# Patient Record
Sex: Female | Born: 1995 | Race: Black or African American | Hispanic: No | Smoking: Never smoker
Health system: Southern US, Community
[De-identification: ages and names within clinical notes are randomized; demographics above are authoritative.]

## PROBLEM LIST (undated history)

## (undated) DIAGNOSIS — E282 Polycystic ovarian syndrome: Secondary | ICD-10-CM

## (undated) HISTORY — PX: LAPAROSCOPIC GASTRIC SLEEVE RESECTION: SHX5895

---

## 2020-04-28 ENCOUNTER — Emergency Department (HOSPITAL_COMMUNITY)
Admission: EM | Admit: 2020-04-28 | Discharge: 2020-04-29 | Disposition: A | Discharge status: Home / Self Care | Payer: BC Managed Care – PPO | Attending: Emergency Medicine | Admitting: Emergency Medicine

## 2020-04-28 ENCOUNTER — Emergency Department (HOSPITAL_COMMUNITY): Payer: BC Managed Care – PPO

## 2020-04-28 ENCOUNTER — Other Ambulatory Visit: Payer: Self-pay

## 2020-04-28 DIAGNOSIS — Z9884 Bariatric surgery status: Secondary | ICD-10-CM | POA: Insufficient documentation

## 2020-04-28 DIAGNOSIS — R112 Nausea with vomiting, unspecified: Secondary | ICD-10-CM | POA: Insufficient documentation

## 2020-04-28 DIAGNOSIS — R1110 Vomiting, unspecified: Secondary | ICD-10-CM | POA: Diagnosis present

## 2020-04-28 LAB — URINALYSIS, ROUTINE W REFLEX MICROSCOPIC
Glucose, UA: NEGATIVE mg/dL
Ketones, ur: 80 mg/dL — AB
Leukocytes,Ua: NEGATIVE
Nitrite: NEGATIVE
Protein, ur: 100 mg/dL — AB
Specific Gravity, Urine: 1.029 (ref 1.005–1.030)
pH: 5 (ref 5.0–8.0)

## 2020-04-28 LAB — COMPREHENSIVE METABOLIC PANEL
ALT: 125 U/L — ABNORMAL HIGH (ref 0–44)
AST: 69 U/L — ABNORMAL HIGH (ref 15–41)
Albumin: 4 g/dL (ref 3.5–5.0)
Alkaline Phosphatase: 50 U/L (ref 38–126)
Anion gap: 19 — ABNORMAL HIGH (ref 5–15)
BUN: 8 mg/dL (ref 6–20)
CO2: 19 mmol/L — ABNORMAL LOW (ref 22–32)
Calcium: 9.9 mg/dL (ref 8.9–10.3)
Chloride: 102 mmol/L (ref 98–111)
Creatinine, Ser: 1.02 mg/dL — ABNORMAL HIGH (ref 0.44–1.00)
GFR, Estimated: >60 mL/min (ref >60)
Glucose, Bld: 102 mg/dL — ABNORMAL HIGH (ref 70–99)
Potassium: 3.7 mmol/L (ref 3.5–5.1)
Sodium: 140 mmol/L (ref 135–145)
Total Bilirubin: 1.1 mg/dL (ref 0.3–1.2)
Total Protein: 7.2 g/dL (ref 6.5–8.1)

## 2020-04-28 LAB — CBC
HCT: 42.9 % (ref 36.0–46.0)
Hemoglobin: 13.6 g/dL (ref 12.0–15.0)
MCH: 27.6 pg (ref 26.0–34.0)
MCHC: 31.7 g/dL (ref 30.0–36.0)
MCV: 87 fL (ref 80.0–100.0)
Platelets: 287 10*3/uL (ref 150–400)
RBC: 4.93 MIL/uL (ref 3.87–5.11)
RDW: 13.8 % (ref 11.5–15.5)
WBC: 10 10*3/uL (ref 4.0–10.5)
nRBC: 0 % (ref 0.0–0.2)

## 2020-04-28 LAB — LIPASE, BLOOD: Lipase: 48 U/L (ref 11–51)

## 2020-04-28 LAB — I-STAT BETA HCG BLOOD, ED (MC, WL, AP ONLY): I-stat hCG, quantitative: <5 m[IU]/mL (ref <5)

## 2020-04-28 MED ORDER — METOCLOPRAMIDE HCL 5 MG/ML IJ SOLN
10.0000 mg | Freq: Once | INTRAMUSCULAR | Status: AC
  Administered 2020-04-28: 10 mg via INTRAVENOUS
  Filled 2020-04-28: qty 2

## 2020-04-28 MED ORDER — DIPHENHYDRAMINE HCL 50 MG/ML IJ SOLN
25.0000 mg | Freq: Once | INTRAMUSCULAR | Status: AC
  Administered 2020-04-28: 25 mg via INTRAVENOUS
  Filled 2020-04-28: qty 1

## 2020-04-28 MED ORDER — SODIUM CHLORIDE 0.9 % IV BOLUS
1000.0000 mL | Freq: Once | INTRAVENOUS | Status: AC
  Administered 2020-04-28: 1000 mL via INTRAVENOUS

## 2020-04-28 MED ORDER — IOHEXOL 300 MG/ML  SOLN
100.0000 mL | Freq: Once | INTRAMUSCULAR | Status: AC | PRN
  Administered 2020-04-28: 100 mL via INTRAVENOUS

## 2020-04-28 NOTE — ED Triage Notes (Signed)
Pt reports n/v without abdominal pain x 1 week. 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 Unable to tolerate PO intake. Endorses chest pain with deep breaths.

## 2020-04-28 NOTE — ED Notes (Signed)
Pt reports heavy period x9 days.

## 2020-04-28 NOTE — ED Provider Notes (Signed)
[facility] EMERGENCY DEPARTMENT Provider Note   CSN: [id number] Arrival date & time: 04/28/20  1630     History Chief Complaint  Patient presents with  . Vomiting    Courtney Wiley is a 24 y.o. female.  Patient presents to the emergency department with a chief complaint of nausea and vomiting.  She is 6 weeks status post gastric sleeve at [facility] [place].  She states that for the past 1 week, she has had persistent nausea and vomiting.  She was seen by her surgeon 2 days ago and had normal barium swallow study.  She states that he told her he would start her on IV fluid if she continued to vomit, but the office was closed today due to the weekend.  She denies any fevers or chills.  She reports mild abdominal discomfort.  She denies any other associated symptoms.  The history is provided by the patient. No language interpreter was used.       No past medical history on file.  There are no problems to display for this patient.  OB History   No obstetric history on file.     No family history on file.  Social History   Tobacco Use  . Smoking status: Not on file  Substance Use Topics  . Alcohol use: Not on file  . Drug use: Not on file    Home Medications Prior to Admission medications   Not on File    Allergies    Patient has no known allergies.  Review of Systems   Review of Systems  All other systems reviewed and are negative.   Physical Exam Updated Vital Signs BP 134/86   Pulse 85   Temp 98.2 F (36.8 C) (Oral)   Resp 18   Ht 5\' 2"  (1.575 m)   Wt 94.3 kg   SpO2 100%   BMI 38.04 kg/m   Physical Exam Vitals and nursing note reviewed.  Constitutional:      General: She is not in acute distress.    Appearance: She is well-developed.  HENT:     Head: Normocephalic and atraumatic.  Eyes:     Conjunctiva/sclera: Conjunctivae normal.  Cardiovascular:     Rate and Rhythm: Normal rate and regular rhythm.     Heart sounds:  No murmur heard.   Pulmonary:     Effort: Pulmonary effort is normal. No respiratory distress.     Breath sounds: Normal breath sounds.  Abdominal:     Palpations: Abdomen is soft.     Tenderness: There is no abdominal tenderness.  Musculoskeletal:        General: Normal range of motion.     Cervical back: Neck supple.  Skin:    General: Skin is warm and dry.  Neurological:     Mental Status: She is alert and oriented to person, place, and time.  Psychiatric:        Mood and Affect: Mood normal.        Behavior: Behavior normal.     ED Results / Procedures / Treatments   Labs (all labs ordered are listed, but only abnormal results are displayed) Labs Reviewed  COMPREHENSIVE METABOLIC PANEL - Abnormal; Notable for the following components:      Result Value   CO2 19 (*)    Glucose, Bld 102 (*)    Creatinine, Ser 1.02 (*)    AST 69 (*)    ALT 125 (*)    Anion gap  19 (*)    All other components within normal limits  URINALYSIS, ROUTINE W REFLEX MICROSCOPIC - Abnormal; Notable for the following components:   Color, Urine AMBER (*)    APPearance HAZY (*)    Hgb urine dipstick LARGE (*)    Bilirubin Urine SMALL (*)    Ketones, ur 80 (*)    Protein, ur 100 (*)    Bacteria, UA RARE (*)    All other components within normal limits  LIPASE, BLOOD  CBC  I-STAT BETA HCG BLOOD, ED ([facility], [facility], [facility] ONLY)    EKG EKG Interpretation  Date/Time:  Saturday April 28 2020 16:38:15 EDT Ventricular Rate:  101 PR Interval:  138 QRS Duration: 70 QT Interval:  320 QTC Calculation: 414 R Axis:   14 Text Interpretation: Sinus tachycardia Cannot rule out Anterior infarct , age undetermined Abnormal ECG No previous ECGs available Confirmed by Richardean Canal [phone]) on 04/28/2020 9:37:26 PM   Radiology No results found.  Procedures Procedures (including critical care time)  Medications Ordered in ED Medications  sodium chloride 0.9 % bolus 1,000 mL (1,000 mLs Intravenous New Bag/Given  04/28/20 2252)  metoCLOPramide (REGLAN) injection 10 mg (10 mg Intravenous Given 04/28/20 2255)  diphenhydrAMINE (BENADRYL) injection 25 mg (25 mg Intravenous Given 04/28/20 2254)    ED Course  I have reviewed the triage vital signs and the nursing notes.  Pertinent labs & imaging results that were available during my care of the patient were reviewed by me and considered in my medical decision making (see chart for details).    MDM Rules/Calculators/A&P                          This patient complains of nausea and vomiting, this involves an extensive number of treatment options, and is a complaint that carries with it a high risk of complications and morbidity.    Differential Dx Obstruction, post-surgical complication, dehydration  Pertinent Labs I ordered, reviewed, and interpreted labs, which included CBC without leukocytosis or evidence of anemia, CMP notable for mildly elevated LFTs and slightly elevated creatinine, no electrolyte derangement, lipase is normal, pregnancy test negative.  Imaging Interpretation I ordered imaging studies which included CT abdomen/pelvis, which showed no obstructive process, there is an incidental finding of a subacute spleen hematoma, thought to have been from retractor injury to spleen.   Medications I ordered medication Reglan, Benadryl, and IV fluid for nausea and dehydration.  Reassessments After the interventions stated above, I reevaluated the patient and found comfortable appearing.  I notified the patient of her CT findings including the incidental splenic injury finding.  Consultants Case and CT findings discussed with Dr. Janee Morn from trauma surgery, who states that the spleen hematoma should reabsorb.  No treatment indicated at 6 weeks from presumed injury with stable H/H.  Plan Discharge after fluids.  She will need to follow-up with her surgeon.  She does not appear toxic or extremely dehydrated.  Will trial rectal  Phenergan.    Final Clinical Impression(s) / ED Diagnoses Final diagnoses:  Nausea and vomiting, intractability of vomiting not specified, unspecified vomiting type    Rx / DC Orders ED Discharge Orders         Ordered    promethazine (PHENERGAN) 25 MG suppository  Every 6 hours PRN        04/29/20 0311           Roxy Horseman, PA-C 04/29/20 0343    Silverio Lay,  Gonzella Lex, MD 05/02/20 [phone]

## 2020-04-29 MED ORDER — LACTATED RINGERS IV BOLUS
1000.0000 mL | Freq: Once | INTRAVENOUS | Status: AC
  Administered 2020-04-29: 1000 mL via INTRAVENOUS

## 2020-04-29 MED ORDER — PROMETHAZINE HCL 25 MG RE SUPP: 25.0000 mg | Freq: Four times a day (QID) | RECTAL | 0 refills | Status: DC | PRN

## 2020-04-29 NOTE — Discharge Instructions (Signed)
Your CT scan did not reveal any obstructive process.  There was an incidental finding of a healing spleen laceration.  No further treatment should be needed in regard to this injury.  Please take medications as prescribed.  Please follow-up with your doctor.  Return for new or worsening symptoms.

## 2020-10-31 ENCOUNTER — Other Ambulatory Visit: Payer: Self-pay

## 2020-10-31 ENCOUNTER — Ambulatory Visit (HOSPITAL_COMMUNITY)
Admission: EM | Admit: 2020-10-31 | Discharge: 2020-10-31 | Disposition: A | Discharge status: Home / Self Care | Payer: BC Managed Care – PPO

## 2020-10-31 DIAGNOSIS — R45851 Suicidal ideations: Secondary | ICD-10-CM | POA: Insufficient documentation

## 2020-10-31 DIAGNOSIS — F4322 Adjustment disorder with anxiety: Secondary | ICD-10-CM | POA: Diagnosis not present

## 2020-10-31 NOTE — Discharge Instructions (Signed)
°  Discharge recommendations:  °Patient is to take medications as prescribed. °Please see information for follow-up appointment with psychiatry and therapy. °Please follow up with your primary care provider for all medical related needs.  ° °Therapy: We recommend that patient participate in individual therapy to address mental health concerns. ° ° ° ° °Safety:  °The patient should abstain from use of illicit substances/drugs and abuse of any medications. °If symptoms worsen or do not continue to improve or if the patient becomes actively suicidal or homicidal then it is recommended that the patient return to the closest hospital emergency department, the [facility], or call 911 for further evaluation and treatment. °National Suicide Prevention Lifeline 1-800-SUICIDE or 1-800-273-8255.  °

## 2020-10-31 NOTE — Progress Notes (Signed)
   10/31/20 1736  [facility] Triage Screening (Walk-ins at [facility] only)  How Did You Hear About [place]? Other (Comment) (Patient presents with her supervisor)  What Is the Reason for Your Visit/Call Today? Patient presents with her supervisor, Jason Fila, due to safety concerns.  Patient was at work today, when she took a call and got pretty upset.  She left work and didn't return for a while. When she returned, she infomed Deanna that she doesn't want to be here.  She clarified that she doesn't want to live and stated she needs help.  Patient asked Deanna to drive her, as she was having thoughts to drive her car off the road.  Patient is tearful initially, however now is saying she is not suicidal and "I really don't want to die."  Patient states the primary stressor is dealing a boyfriend who she thinks is Bipolar and won't seek treatment.  They have had multiple break ups and he broke up with her again today.  She is hopeful she can convince him to seek treatment.  How Long Has This Been Causing You Problems? 1 wk - 1 month  Have You Recently Had Any Thoughts About Hurting Yourself? Yes  How long ago did you have thoughts about hurting yourself? SI today, after upsetting phone call with boyfriend  - broke up with her  Are You Planning to Commit Suicide/Harm Yourself At This time? No  Have you Recently Had Thoughts About Hurting Someone Karolee Ohs? No  Are You Planning To Harm Someone At This Time? No  Are you currently experiencing any auditory, visual or other hallucinations? No  Have You Used Any Alcohol or Drugs in the Past 24 Hours? No  Do you have any current medical co-morbidities that require immediate attention? No  What Do You Feel Would Help You the Most Today? Treatment for Depression or other mood problem  If access to [facility] was not available, would you have sought care in the Emergency Department? Yes  Determination of Need Urgent (48 hours)  Options For Referral Outpatient [facility]

## 2020-10-31 NOTE — ED Provider Notes (Signed)
Behavioral Health Urgent Care Medical Screening Exam  Patient Name: Courtney Wiley MRN: [id number] Date of Evaluation: 10/31/20 Chief Complaint: Chief Complaint/Presenting Problem: Courtney Wiley is a 25yo female that presents to [facility] as a walk in for assessment of suicidal thoughts. 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  Pt reports that she has had suicidal ideation in the past, typically triggered by breakups. Pt denies that she is currently being treated by a psychiatrist or therapist. Pt reports that she did see a psychologist in the past but for a short period of time. Pt reports that she is currently having suicidal ideation with no specified plans or intent to follow through "I don't want to go to hell".  Pt denies any HI or AVH.  Pt denies any substance use other than social etoh. Pt is very upset about recent breakup with boyfriend "I think he's bipolar because one day he says that he loves me and the next he wants to break up". Pt states that she currently lives with boyfriend and all of her positive activities and hobbies surround doing things with him. Pt reports that she had weight loss surgery in Oct. 2021 and was hospitalized for three months due to post-operative complications. Pt states that she is now able to eat without complications and makes healthy choices. Pt has lost 70 lbs since the surgery. Pt presents with appropriate dress, normal motor activity, fair insight, anxious/depressed/tearful mood, with appropriate thought content. Pt does not feel that she is a danger to herself at time of assessment. 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 female who presents to [facility] as a walk-in after expressing suicidal thoughts. Patient reports that she received a call from her boyfriend while she was at work. States that her boyfriend told her that he wanted to break up. She states that she made a  suicidal statement "to get attention." She states that she was not suicidal when she made the statement. She states "I say that sometimes when I get mad, but I am not suicidal and I do not want to die." She denies a history of suicide attempts. She denies feeling of depression, hopelessness, worthlessness. She reports some anxiety related to break up. She denies a history of psychiatric treatment. States that she did see a psychologist once when she 21, after her parents divorce. States that she has never taken psychotropics.  On evaluation patient is alert and oriented x 4, pleasant, and cooperative. Speech is clear and coherent. Mood is anxious and affect is congruent with mood. Thought process is coherent and thought content is logical. Denies auditory and visual hallucinations. No indication that patient is responding to internal stimuli. No evidence of delusional thought content. Denies suicidal ideations. Denies homicidal ideations. Reports occasional alcohol use. Denies use of other illicit substances.   Psychiatric Specialty Exam  Presentation  General Appearance:Appropriate for Environment; Well Groomed  Eye Contact:Good  Speech:Clear and Coherent; Normal Rate  Speech Volume:Normal  Handedness:No data recorded  Mood and Affect  Mood:Anxious  Affect:Congruent   Thought Process  Thought Processes:Coherent; Goal Directed; Linear  Descriptions of Associations:Intact  Orientation:Full (Time, Place and Person)  Thought Content:WDL  Diagnosis of Schizophrenia or Schizoaffective disorder in past: No   Hallucinations:None  Ideas of Reference:None  Suicidal Thoughts:No  Homicidal Thoughts:No   Sensorium  Memory:Immediate Good; Recent Good; Remote Good  Judgment:Fair  Insight:Good   Executive Functions  Concentration:Good  Attention Span:Good  Recall:Good  Fund of Knowledge:Good  Language:Good   Psychomotor Activity  Psychomotor Activity:Normal   Assets   Assets:Communication Skills; Desire for Improvement; Housing; Physical Health; Social Support; Resilience; Transportation   Sleep  Sleep:Good  Number of hours: No data recorded  No data recorded  Physical Exam: Physical Exam Constitutional:      General: She is not in acute distress.    Appearance: She is not ill-appearing, toxic-appearing or diaphoretic.  HENT:     Head: Normocephalic.     Right Ear: External ear normal.     Left Ear: External ear normal.  Eyes:     Conjunctiva/sclera: Conjunctivae normal.     Pupils: Pupils are equal, round, and reactive to light.  Cardiovascular:     Rate and Rhythm: Normal rate.  Pulmonary:     Effort: Pulmonary effort is normal. No respiratory distress.  Musculoskeletal:        General: Normal range of motion.  Skin:    General: Skin is warm and dry.  Neurological:     Mental Status: She is alert and oriented to person, place, and time.  Psychiatric:        Mood and Affect: Mood is anxious. Mood is not depressed.        Thought Content: Thought content is not paranoid or delusional. Thought content does not include homicidal or suicidal ideation.    Review of Systems  Constitutional: Negative for chills, diaphoresis, fever, malaise/fatigue and weight loss.  HENT: Negative for congestion.   Respiratory: Negative for cough and shortness of breath.   Cardiovascular: Negative for chest pain and palpitations.  Gastrointestinal: Negative for diarrhea, nausea and vomiting.  Neurological: Negative for dizziness and seizures.  Psychiatric/Behavioral: Negative for depression, hallucinations, memory loss, substance abuse and suicidal ideas. The patient is nervous/anxious. The patient does not have insomnia.   All other systems reviewed and are negative.  Blood pressure (!) 143/95, pulse 96, temperature 97.8 F (36.6 C), temperature source Temporal, resp. rate 18, SpO2 98 %. There is no height or weight on file to calculate  BMI.  Musculoskeletal: Strength & Muscle Tone: within normal limits Gait & Station: normal Patient leans: N/A  Demographic Factors:  Adolescent or young adult  Loss Factors: NA  Historical Factors: Family history of mental illness or substance abuse and NA  Risk Reduction Factors:   Sense of responsibility to family, Religious beliefs about death, Employed and Living with another person, especially a relative  Continued Clinical Symptoms:  anxiety  Cognitive Features That Contribute To Risk:  None    Suicide Risk:  Mild:  Suicidal ideation of limited frequency, intensity, duration, and specificity.  There are no identifiable plans, no associated intent, mild dysphoria and related symptoms, good self-control (both objective and subjective assessment), few other risk factors, and identifiable protective factors, including available and accessible social support.  Patient does not appear to be an imminent risk to self or others.   [facility] Discharge Disposition for Follow up and Recommendations: Based on my evaluation the patient does not appear to have an emergency medical condition and can be discharged with resources and follow up care in outpatient services for Medication Management and Individual Therapy   TTS provided with outpatient resources.   Patient's brother provided transportation home.    Jackelyn Poling, NP 10/31/2020, 7:59 PM
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
## 2021-04-14 ENCOUNTER — Other Ambulatory Visit: Payer: Self-pay

## 2021-04-14 ENCOUNTER — Ambulatory Visit (HOSPITAL_COMMUNITY)
Admission: EM | Admit: 2021-04-14 | Discharge: 2021-04-14 | Disposition: A | Discharge status: Home / Self Care | Payer: Managed Care, Other (non HMO) | Attending: Student | Admitting: Student

## 2021-04-14 ENCOUNTER — Encounter (HOSPITAL_COMMUNITY): Payer: Self-pay | Admitting: Emergency Medicine

## 2021-04-14 DIAGNOSIS — Z789 Other specified health status: Secondary | ICD-10-CM

## 2021-04-14 DIAGNOSIS — N926 Irregular menstruation, unspecified: Secondary | ICD-10-CM | POA: Diagnosis not present

## 2021-04-14 DIAGNOSIS — Z3202 Encounter for pregnancy test, result negative: Secondary | ICD-10-CM

## 2021-04-14 HISTORY — DX: Polycystic ovarian syndrome: E28.2

## 2021-04-14 LAB — POC URINE PREG, ED: Preg Test, Ur: NEGATIVE

## 2021-04-14 NOTE — Discharge Instructions (Addendum)
-  Your pregnancy test was negative today! Continue patch to prevent pregnancy.

## 2021-04-14 NOTE — ED Provider Notes (Signed)
[facility]    CSN: [id number] Arrival date & time: 04/14/21  1255      History   Chief Complaint Chief Complaint  Patient presents with   Possible Pregnancy    HPI Courtney Wiley is a 25 y.o. female presenting with concern for pregnancy.  States that she has a history of gastric sleeve, and experiences some nausea related to this.  Following nausea few weeks ago it took 6 home pregnancy tests, these were both negative and positive.  Took a digital pregnancy test 1 day ago and this was positive.  Coming here today to confirm.  Periods are irregular at baseline, LMP 8/23.  Patch for contraception. Nausea has resolved. Does not desire to be pregnant.  HPI  Past Medical History:  Diagnosis Date   PCOS (polycystic ovarian syndrome)     Patient Active Problem List   Diagnosis Date Noted   Adjustment disorder with anxious mood    Suicidal thoughts     Past Surgical History:  Procedure Laterality Date   LAPAROSCOPIC GASTRIC SLEEVE RESECTION      OB History   No obstetric history on file.      Home Medications    Prior to Admission medications   Medication Sig Start Date End Date Taking? Authorizing Provider  promethazine (PHENERGAN) 25 MG suppository Place 1 suppository (25 mg total) rectally every 6 (six) hours as needed for nausea or vomiting. 04/29/20   Roxy Horseman, PA-C    Family History No family history on file.  Social History     Allergies   Patient has no known allergies.   Review of Systems Review of Systems  Constitutional:  Negative for appetite change, chills, diaphoresis and fever.  Respiratory:  Negative for shortness of breath.   Cardiovascular:  Negative for chest pain.  Gastrointestinal:  Negative for abdominal pain, blood in stool, constipation, diarrhea, nausea and vomiting.  Genitourinary:  Negative for decreased urine volume, difficulty urinating, dysuria, flank pain, frequency, genital sores, hematuria, menstrual  problem, pelvic pain, urgency, vaginal bleeding, vaginal discharge and vaginal pain.  Musculoskeletal:  Negative for back pain.  Neurological:  Negative for dizziness, weakness and light-headedness.  All other systems reviewed and are negative.   Physical Exam Triage Vital Signs ED Triage Vitals  Enc Vitals Group     BP 04/14/21 1341 121/87     Pulse Rate 04/14/21 1341 85     Resp 04/14/21 1341 18     Temp 04/14/21 1341 98.2 F (36.8 C)     Temp Source 04/14/21 1341 Oral     SpO2 04/14/21 1341 98 %     Weight --      Height --      Head Circumference --      Peak Flow --      Pain Score 04/14/21 1339 0     Pain Loc --      Pain Edu? --      Excl. in GC? --    No data found.  Updated Vital Signs BP 121/87 (BP Location: Right Arm)   Pulse 85   Temp 98.2 F (36.8 C) (Oral)   Resp 18   LMP 03/12/2021   SpO2 98%   Visual Acuity Right Eye Distance:   Left Eye Distance:   Bilateral Distance:    Right Eye Near:   Left Eye Near:    Bilateral Near:     Physical Exam Vitals reviewed.  Constitutional:      General: She  is not in acute distress.    Appearance: Normal appearance. She is not ill-appearing.  HENT:     Head: Normocephalic and atraumatic.     Mouth/Throat:     Mouth: Mucous membranes are moist.     Comments: Moist mucous membranes Eyes:     Extraocular Movements: Extraocular movements intact.     Pupils: Pupils are equal, round, and reactive to light.  Cardiovascular:     Rate and Rhythm: Normal rate and regular rhythm.     Heart sounds: Normal heart sounds.  Pulmonary:     Effort: Pulmonary effort is normal.     Breath sounds: Normal breath sounds. No wheezing, rhonchi or rales.  Abdominal:     General: Bowel sounds are normal. There is no distension.     Palpations: Abdomen is soft. There is no mass.     Tenderness: There is no abdominal tenderness. There is no right CVA tenderness, left CVA tenderness, guarding or rebound.  Skin:    General:  Skin is warm.     Capillary Refill: Capillary refill takes less than 2 seconds.     Comments: Good skin turgor  Neurological:     General: No focal deficit present.     Mental Status: She is alert and oriented to person, place, and time.  Psychiatric:        Mood and Affect: Mood normal.        Behavior: Behavior normal.     UC Treatments / Results  Labs (all labs ordered are listed, but only abnormal results are displayed) Labs Reviewed  POC URINE PREG, ED    EKG   Radiology No results found.  Procedures Procedures (including critical care time)  Medications Ordered in UC Medications - No data to display  Initial Impression / Assessment and Plan / UC Course  I have reviewed the triage vital signs and the nursing notes.  Pertinent labs & imaging results that were available during my care of the patient were reviewed by me and considered in my medical decision making (see chart for details).     This patient is a very pleasant 25 y.o. year old female presenting for pregnancy test. Negative. Reassurance provided. Continue patch for contraception. ED return precautions discussed. Patient verbalizes understanding and agreement.    Final Clinical Impressions(s) / UC Diagnoses   Final diagnoses:  Negative pregnancy test  Uses hormonal contraceptive patch as primary birth control method  Irregular periods     Discharge Instructions      -Your pregnancy test was negative today! Continue patch to prevent pregnancy.   ED Prescriptions   None    PDMP not reviewed this encounter.   Rhys Martini, PA-C 04/14/21 1406

## 2021-04-14 NOTE — ED Triage Notes (Signed)
Pt taken many home test and some show both positive and negative for pregnancy. Pt wants to be sure.

## 2022-05-03 IMAGING — CT CT ABD-PELV W/ CM
2 of 4 series · 16 of 46 positions shown, 18 images · IV contrast (Omni 300)
Comparison: None

CLINICAL DATA: Abdominal distension

EXAM:
CT ABDOMEN AND PELVIS WITH CONTRAST
TECHNIQUE: Multidetector CT imaging of the abdomen and pelvis was performed
using the standard protocol following bolus administration of
intravenous contrast.
CONTRAST:  100mL OMNIPAQUE IOHEXOL 300 MG/ML  SOLN

[Series 3: a/p w/ 5mm · axial · 0.98mm/px · z∈[+614,+1054]mm · 13 of 97 slices shown, 15 images]
[im 5/97  soft-tissue]
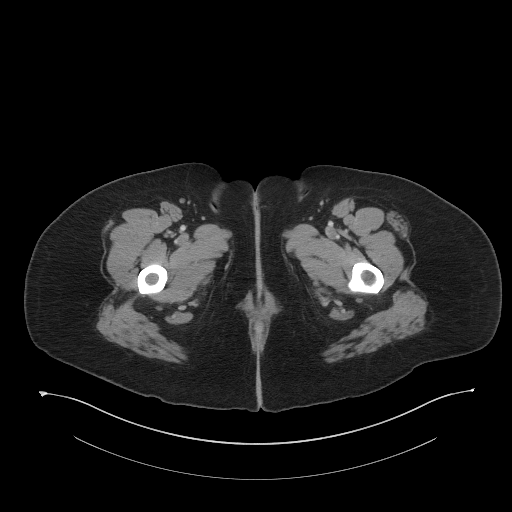
[im 5/97  bone]
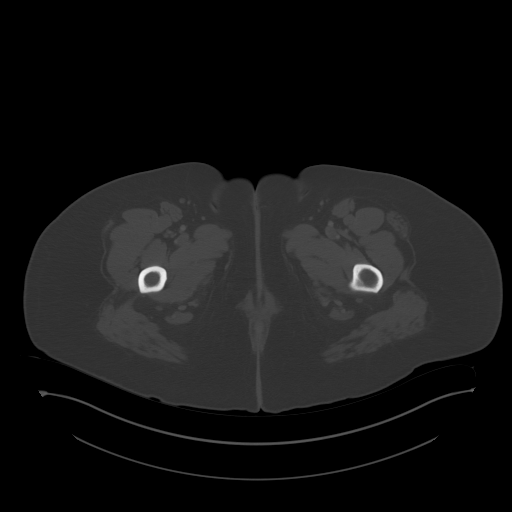
[im 13/97  soft-tissue]
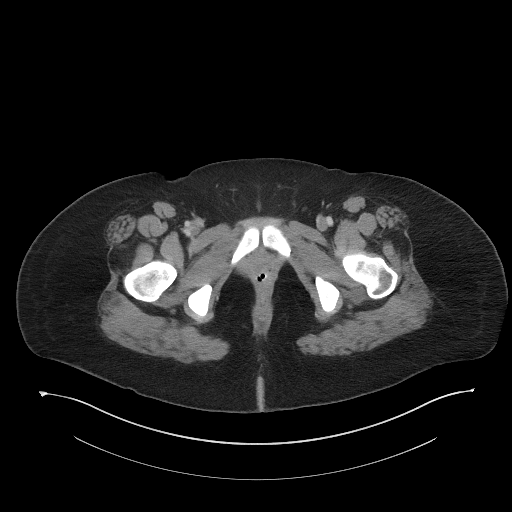
[im 21/97  soft-tissue]
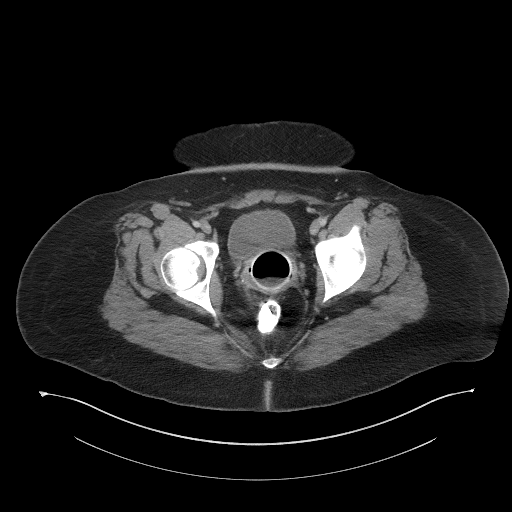
[im 29/97  soft-tissue]
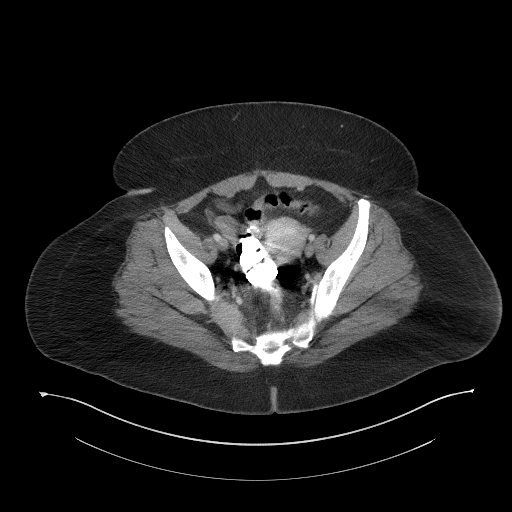
[im 33/97  soft-tissue]
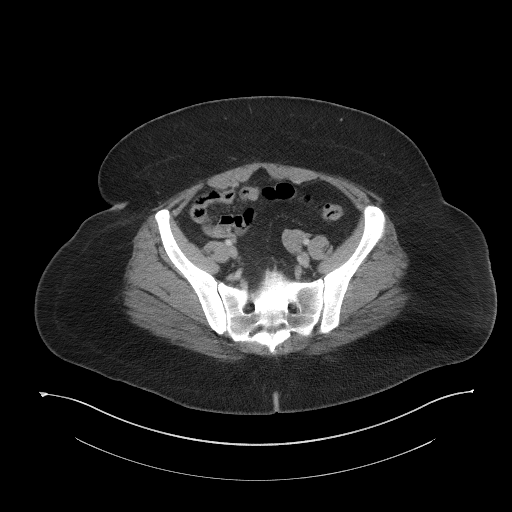
[im 41/97  soft-tissue]
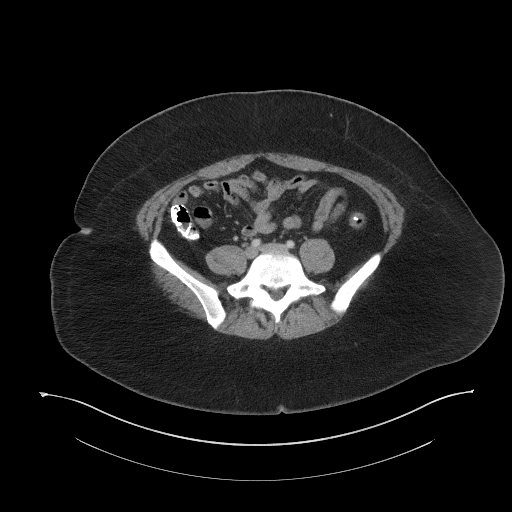
[im 49/97  soft-tissue]
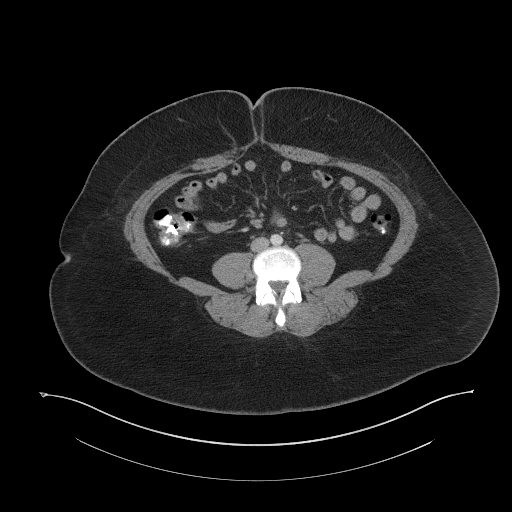
[im 57/97  soft-tissue]
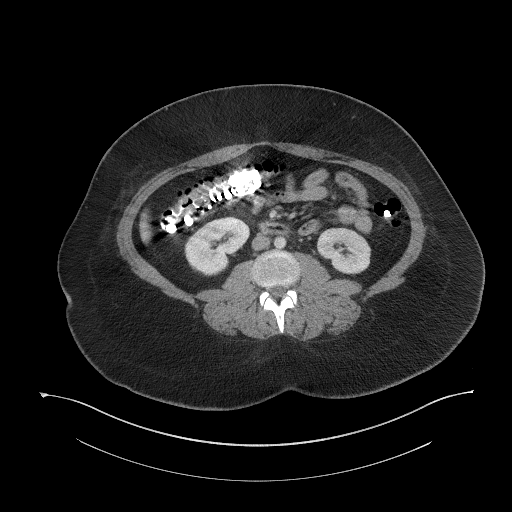
[im 65/97  soft-tissue]
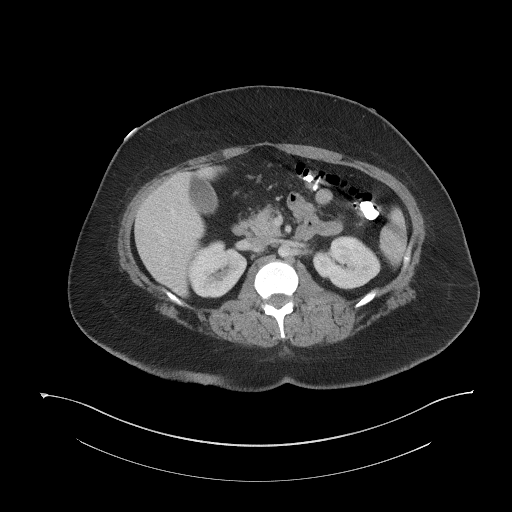
[im 65/97  bone]
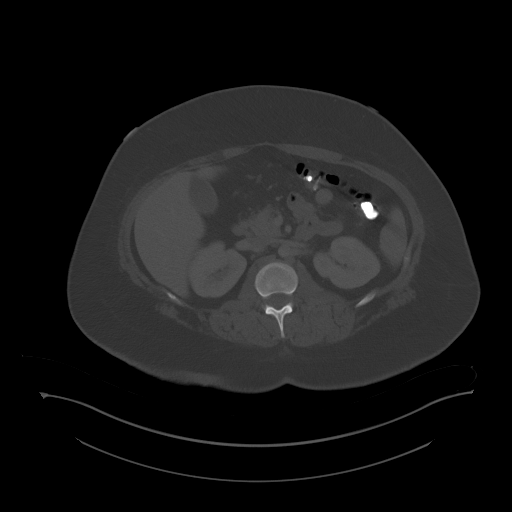
[im 69/97  soft-tissue]
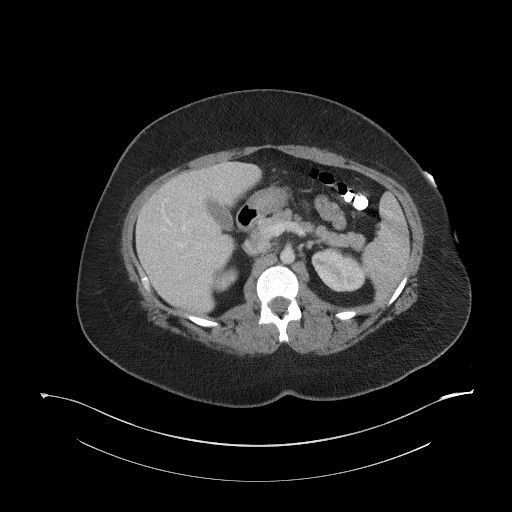
[im 77/97  soft-tissue]
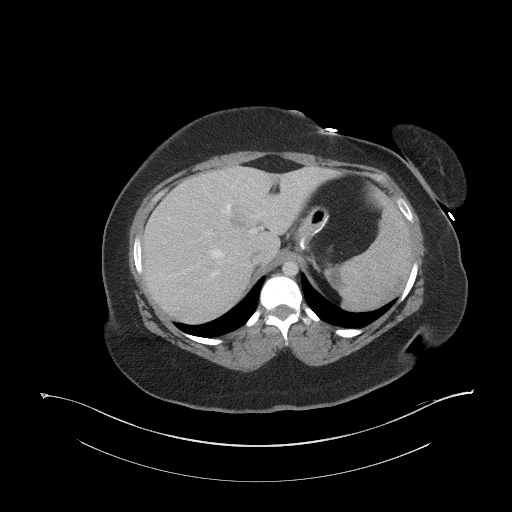
[im 85/97  soft-tissue]
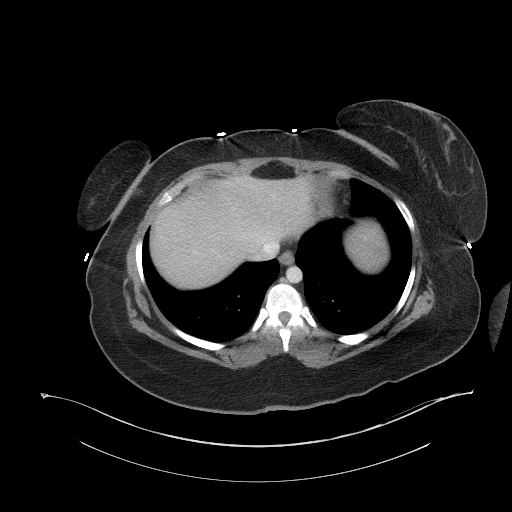
[im 93/97  soft-tissue]
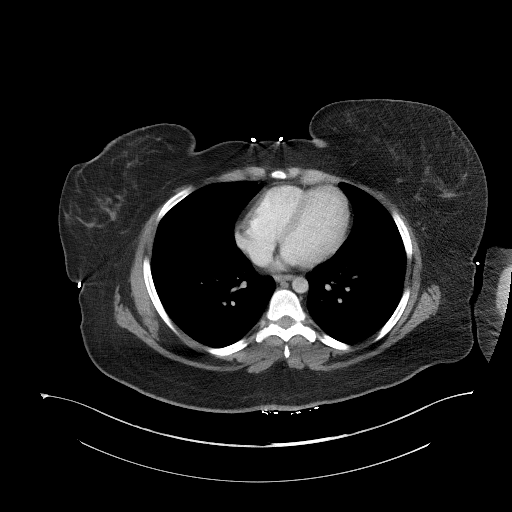

[Series 6: a/p w/ cor · coronal · 0.95mm/px · 3 of 165 slices shown]
[im 55/165  soft-tissue]
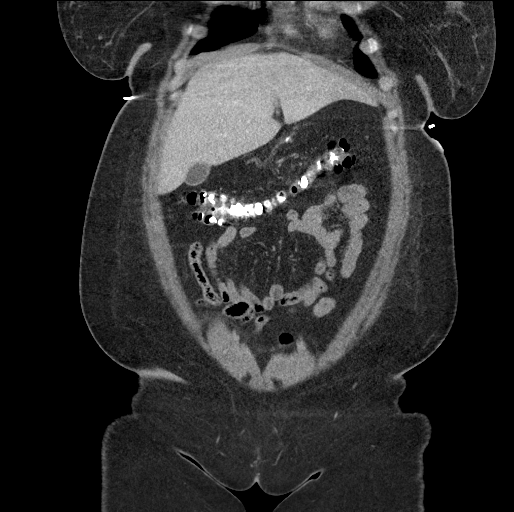
[im 73/165  soft-tissue]
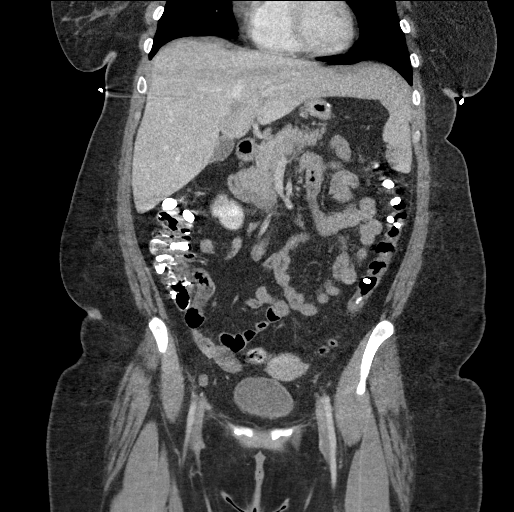
[im 92/165  soft-tissue]
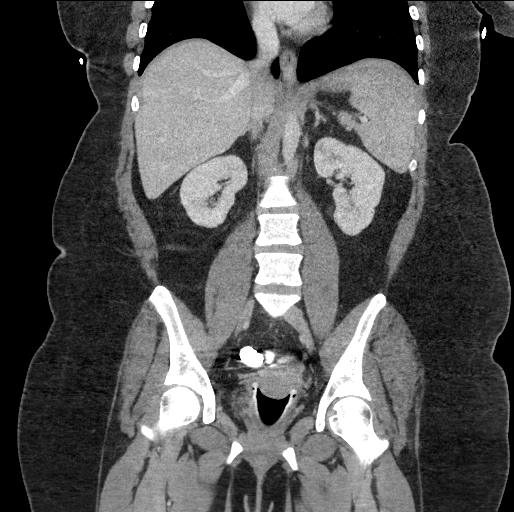

[16 of 46 positions shown; findings below may reference images not displayed]

FINDINGS: Lower chest: Lung bases are clear. Normal heart size. No pericardial
effusion.

Hepatobiliary: No worrisome focal liver lesions. Smooth liver
surface contour. Normal hepatic attenuation. Normal gallbladder and
biliary tree.

Pancreas: Unremarkable. No pancreatic ductal dilatation or
surrounding inflammatory changes.

Spleen: Crescentic subcapsular heterogeneous attenuation collection
extending over the dome of the spleen with a possible splenic cleft
versus laceration approximately 1.9 cm in depth along the
posterosuperior spleen (6/99). No other concerning splenic lesion.
Normal splenic size.

Adrenals/Urinary Tract: Normal adrenal glands. Kidneys are normally
located with symmetric enhancement. No suspicious renal lesion,
urolithiasis or hydronephrosis. Urinary bladder is unremarkable for
degree of distension.

Stomach/Bowel: Distal esophagus is normal. Surgical material along
the greater curvature likely reflecting gastric sleeve. Duodenum is
unremarkable. No small bowel thickening or dilatation. Some retained
high attenuation barium contrast media is seen within the colon and
within the lumen of an otherwise normal appendix with some mild
associated streak artifact. No periappendiceal inflammation or
stranding. No abnormal colonic thickening or dilatation. No evidence
of bowel obstruction.

Vascular/Lymphatic: No significant vascular findings are present. No
enlarged abdominal or pelvic lymph nodes.

Reproductive: Menstrual cup within the vaginal canal. Normal uterus.
No concerning adnexal lesions.

Other: Soft tissue stranding in the left upper abdomen and right mid
abdomen to the right of the umbilicus may reflect sites of prior
port placement. Small fat containing umbilical hernia. No bowel
containing hernias. No abdominopelvic free air or fluid.

Musculoskeletal: No acute osseous abnormality or suspicious osseous
lesion.
IMPRESSION: 1. No evidence of obstruction.
2. Postsurgical changes from sleeve gastrectomy.
3. Mixed attenuation subcapsular collection possibly subacute to
chronic hematoma with layering hemorrhagic products and possible
laceration of the dome of the spleen. Could correlate for a recent
trauma or retractor injury at the time of laparoscopic sleeve
gastrectomy performed 03/16/2020.

These results were called by telephone at the time of interpretation
acknowledged these results.

## 2023-01-15 ENCOUNTER — Encounter (HOSPITAL_COMMUNITY): Payer: Self-pay | Admitting: *Deleted

## 2023-01-15 ENCOUNTER — Ambulatory Visit (HOSPITAL_COMMUNITY)
Admission: EM | Admit: 2023-01-15 | Discharge: 2023-01-15 | Disposition: A | Discharge status: Home / Self Care | Payer: Medicaid Other

## 2023-01-15 DIAGNOSIS — L255 Unspecified contact dermatitis due to plants, except food: Secondary | ICD-10-CM

## 2023-01-15 MED ORDER — DEXAMETHASONE SODIUM PHOSPHATE 10 MG/ML IJ SOLN
INTRAMUSCULAR | Status: AC
  Filled 2023-01-15: qty 1

## 2023-01-15 MED ORDER — DEXAMETHASONE SODIUM PHOSPHATE 10 MG/ML IJ SOLN
10.0000 mg | Freq: Once | INTRAMUSCULAR | Status: AC
  Administered 2023-01-15: 10 mg via INTRAMUSCULAR

## 2023-01-15 MED ORDER — PREDNISONE 10 MG (21) PO TBPK: ORAL_TABLET | Freq: Every day | ORAL | 0 refills | Status: AC

## 2023-01-15 NOTE — Discharge Instructions (Addendum)
Your rash is due to contact with a poisonous plant. Please avoid itching your rash as this can cause secondary bacterial infections in severe cases.  You can continue the topical Sarna cream.   Start taking the prednisone taper as directed. Best to take in the mornings to prevent insomnia.  Avoid alcohol wipes or topical sprays as this is drying and can make it more itchy.  Follow up if rash worsens, or systemic symptoms develop including wheezing.

## 2023-01-15 NOTE — ED Provider Notes (Signed)
[facility]    CSN: [id number] Arrival date & time: 01/15/23  1347      History   Chief Complaint Chief Complaint  Patient presents with   Rash    HPI Courtney Wiley is a 27 y.o. female.   Pleasant 27 year old female presents today due to concerns of a rash.  She states she was out in the yard several days ago pulling down tree branches.  She started abruptly having an itchy red raised rash a day later which has progressively spread.  It is now on her bilateral upper extremities, legs, chest, neck and face.  She has been using over-the-counter Sarna cream without significant improvement.  Husband has similar itchy rash.  She denies swelling of her tongue or throat, dysphagia, cough, shortness of breath or wheezing.  No additional over-the-counter treatments tried.  No additional symptoms.   Rash   Past Medical History:  Diagnosis Date   PCOS (polycystic ovarian syndrome)     Patient Active Problem List   Diagnosis Date Noted   Adjustment disorder with anxious mood    Suicidal thoughts     Past Surgical History:  Procedure Laterality Date   LAPAROSCOPIC GASTRIC SLEEVE RESECTION      OB History   No obstetric history on file.      Home Medications    Prior to Admission medications   Medication Sig Start Date End Date Taking? Authorizing Provider  predniSONE (STERAPRED UNI-PAK 21 TAB) 10 MG (21) TBPK tablet Take by mouth daily. Take 6 tabs by mouth daily for 2 days, then 5 tabs for 2 days, then 4 tabs for 2 days, then 3 tabs for 2 days, 2 tabs for 2 days, then 1 tab by mouth daily for 2 days 01/15/23  Yes Brynlynn Walko L, PA  XULANE 150-35 MCG/24HR transdermal patch Place onto the skin. 11/01/19  Yes [provider]    Family History History reviewed. No pertinent family history.  Social History Social History   Tobacco Use   Smoking status: Never   Smokeless tobacco: Never  Vaping Use   Vaping Use: Never used  Substance Use Topics    Alcohol use: Yes   Drug use: Never     Allergies   Nsaids   Review of Systems Review of Systems  Skin:  Positive for rash.  As per HPI   Physical Exam Triage Vital Signs ED Triage Vitals  Enc Vitals Group     BP 01/15/23 1433 103/69     Pulse Rate 01/15/23 1433 76     Resp 01/15/23 1433 18     Temp 01/15/23 1433 98 F (36.7 C)     Temp Source 01/15/23 1433 Oral     SpO2 01/15/23 1433 99 %     Weight --      Height --      Head Circumference --      Peak Flow --      Pain Score 01/15/23 1430 0     Pain Loc --      Pain Edu? --      Excl. in GC? --    No data found.  Updated Vital Signs BP 103/69 (BP Location: Right Arm)   Pulse 76   Temp 98 F (36.7 C) (Oral)   Resp 18   LMP  (LMP Unknown)   SpO2 99%   Visual Acuity Right Eye Distance:   Left Eye Distance:   Bilateral Distance:    Right Eye Near:  Left Eye Near:    Bilateral Near:     Physical Exam Vitals and nursing note reviewed. Exam conducted with a chaperone present.  Constitutional:      General: She is not in acute distress.    Appearance: Normal appearance. She is not ill-appearing, toxic-appearing or diaphoretic.  HENT:     Head: Normocephalic and atraumatic.     Mouth/Throat:     Mouth: Mucous membranes are moist.  Eyes:     General: No scleral icterus.       Right eye: No discharge.        Left eye: No discharge.     Extraocular Movements: Extraocular movements intact.     Pupils: Pupils are equal, round, and reactive to light.  Cardiovascular:     Rate and Rhythm: Normal rate.  Pulmonary:     Effort: Pulmonary effort is normal. No respiratory distress.  Musculoskeletal:     Cervical back: Normal range of motion and neck supple. No rigidity or tenderness.  Lymphadenopathy:     Cervical: No cervical adenopathy.  Skin:    General: Skin is warm and dry.     Coloration: Skin is not jaundiced.     Findings: Rash (rash scattered across BUE, L neck, anterior chest, and BLE  consistent with contact dermatitis; large areas of excoriation noted, numerous vesicles.) present. No bruising.  Neurological:     General: No focal deficit present.     Mental Status: She is alert and oriented to person, place, and time.      UC Treatments / Results  Labs (all labs ordered are listed, but only abnormal results are displayed) Labs Reviewed - No data to display  EKG   Radiology No results found.  Procedures Procedures (including critical care time)  Medications Ordered in UC Medications  dexamethasone (DECADRON) injection 10 mg (10 mg Intramuscular Given 01/15/23 1452)    Initial Impression / Assessment and Plan / UC Course  I have reviewed the triage vital signs and the nursing notes.  Pertinent labs & imaging results that were available during my care of the patient were reviewed by me and considered in my medical decision making (see chart for details).     Contact dermatitis - likely secondary to poison ivy given contact with plants outside prior to sx onset. IM decadron given in office, will DC pt home on prednisone taper x 12 days. Recommended cleaning shoes and animals that may have also come in contact with plant sap and to clean under nails to prevent further spread.    Final Clinical Impressions(s) / UC Diagnoses   Final diagnoses:  Dermatitis due to plants, including poison ivy, sumac, and oak     Discharge Instructions      Your rash is due to contact with a poisonous plant. Please avoid itching your rash as this can cause secondary bacterial infections in severe cases.  You can continue the topical Sarna cream.   Start taking the prednisone taper as directed. Best to take in the mornings to prevent insomnia.  Avoid alcohol wipes or topical sprays as this is drying and can make it more itchy.  Follow up if rash worsens, or systemic symptoms develop including wheezing.      ED Prescriptions     Medication Sig Dispense Auth.  Provider   predniSONE (STERAPRED UNI-PAK 21 TAB) 10 MG (21) TBPK tablet Take by mouth daily. Take 6 tabs by mouth daily for 2 days, then 5 tabs for 2 days,  then 4 tabs for 2 days, then 3 tabs for 2 days, 2 tabs for 2 days, then 1 tab by mouth daily for 2 days 42 tablet Akayla Brass L, PA      PDMP not reviewed this encounter.   Maretta Bees, [place] 01/15/23 1516

## 2023-01-15 NOTE — ED Triage Notes (Signed)
Pt states she has a rash about a week ago but got worse last 2 days. She states she is itchy. She has been using OTC meds without relief.
